# Patient Record
Sex: Male | Born: 1960 | Race: White | Hispanic: No | Marital: Married | Smoking: Never smoker
Health system: Southern US, Community
[De-identification: ages and names within clinical notes are randomized; demographics above are authoritative.]

---

## 2004-05-11 ENCOUNTER — Encounter: Admission: RE | Admit: 2004-05-11 | Discharge: 2004-05-11 | Payer: Self-pay | Admitting: Gastroenterology

## 2007-09-22 ENCOUNTER — Encounter: Admission: RE | Admit: 2007-09-22 | Discharge: 2007-09-22 | Payer: Self-pay | Admitting: Sports Medicine

## 2007-10-13 ENCOUNTER — Encounter: Admission: RE | Admit: 2007-10-13 | Discharge: 2007-10-13 | Payer: Self-pay | Admitting: Sports Medicine

## 2010-04-27 ENCOUNTER — Encounter: Payer: Self-pay | Admitting: Sports Medicine

## 2011-07-01 ENCOUNTER — Ambulatory Visit (INDEPENDENT_AMBULATORY_CARE_PROVIDER_SITE_OTHER): Payer: 59 | Admitting: Family Medicine

## 2011-07-01 ENCOUNTER — Encounter: Payer: Self-pay | Admitting: Family Medicine

## 2011-07-01 DIAGNOSIS — R030 Elevated blood-pressure reading, without diagnosis of hypertension: Secondary | ICD-10-CM

## 2011-07-01 DIAGNOSIS — Z1211 Encounter for screening for malignant neoplasm of colon: Secondary | ICD-10-CM

## 2011-07-01 DIAGNOSIS — M109 Gout, unspecified: Secondary | ICD-10-CM

## 2011-07-01 DIAGNOSIS — N529 Male erectile dysfunction, unspecified: Secondary | ICD-10-CM

## 2011-07-01 MED ORDER — TADALAFIL 20 MG PO TABS: ORAL_TABLET | ORAL | Status: DC

## 2011-07-01 NOTE — Progress Notes (Signed)
  Subjective:    Patient ID: Eduardo Lynch, male    DOB: May 24, 1960, 51 y.o.   MRN: [id number]  HPI  Patient is here to establish care. He has not had complete physical in several years. He's had what sounds like probable gout involving the foot metatarsophalangeal joint and more recently right wrist. He has seen orthopedist couple times. No other chronic medical problems. He had some recent issues with weight gain. He attributes this to lack of activity and exercise. Started running program 2 weeks ago. No prior surgeries. Currently takes no medications.  Family history significant for mother with type 2 diabetes and father with COPD. Son with type 1 diabetes.  Patient is nonsmoker. Occasional alcohol use.  New issue of progressive erectile dysfunction. He has good libido but difficulty maintaining erection. Never treated for elevated blood pressure.   Review of Systems  Constitutional: Positive for unexpected weight change (weight gain). Negative for fever, activity change, appetite change and fatigue.  HENT: Negative for ear pain, congestion and trouble swallowing.   Eyes: Negative for pain and visual disturbance.  Respiratory: Negative for cough, shortness of breath and wheezing.   Cardiovascular: Negative for chest pain and palpitations.  Gastrointestinal: Negative for nausea, vomiting, abdominal pain, diarrhea, constipation, blood in stool, abdominal distention and rectal pain.  Genitourinary: Negative for dysuria, hematuria and testicular pain.  Musculoskeletal: Negative for joint swelling and arthralgias.  Skin: Negative for rash.  Neurological: Negative for dizziness, syncope and headaches.  Hematological: Negative for adenopathy.  Psychiatric/Behavioral: Negative for confusion and dysphoric mood.       Objective:   Physical Exam  Constitutional: He is oriented to person, place, and time. He appears well-developed and well-nourished.  HENT:  Right Ear: External ear normal.    Left Ear: External ear normal.  Mouth/Throat: Oropharynx is clear and moist.  Neck: Neck supple. No thyromegaly present.  Cardiovascular: Normal rate and regular rhythm.   Pulmonary/Chest: Effort normal and breath sounds normal. No respiratory distress. He has no wheezes. He has no rales.  Musculoskeletal: He exhibits no edema.  Lymphadenopathy:    He has no cervical adenopathy.  Neurological: He is alert and oriented to person, place, and time.          Assessment & Plan:  #1 weight gain. Probably related to sedentary activity. Weight loss strategies discussed. Schedule complete physical.  #2 elevated blood pressure. Repeat blood pressure after rest improved to 144/80. Recommend lifestyle modification and reassess at physical in 3 months #3 erectile dysfunction. Trial of Cialis 20 mg every other day as needed #4 Health maintenance.  No hx of colon cancer screening and pt wishes to proceed.

## 2011-09-09 ENCOUNTER — Encounter: Payer: Self-pay | Admitting: Gastroenterology

## 2011-09-30 ENCOUNTER — Ambulatory Visit: Payer: 59 | Admitting: Family Medicine

## 2011-10-04 ENCOUNTER — Ambulatory Visit: Payer: 59 | Admitting: Family Medicine

## 2011-11-10 ENCOUNTER — Other Ambulatory Visit: Payer: 59

## 2011-11-17 ENCOUNTER — Encounter: Payer: 59 | Admitting: Family Medicine

## 2011-11-23 ENCOUNTER — Other Ambulatory Visit: Payer: Self-pay | Admitting: Family Medicine

## 2011-12-05 ENCOUNTER — Emergency Department (HOSPITAL_BASED_OUTPATIENT_CLINIC_OR_DEPARTMENT_OTHER)
Admission: EM | Admit: 2011-12-05 | Discharge: 2011-12-05 | Disposition: A | Discharge status: Home / Self Care | Payer: 59 | Attending: Emergency Medicine | Admitting: Emergency Medicine

## 2011-12-05 ENCOUNTER — Encounter (HOSPITAL_BASED_OUTPATIENT_CLINIC_OR_DEPARTMENT_OTHER): Payer: Self-pay | Admitting: *Deleted

## 2011-12-05 DIAGNOSIS — M109 Gout, unspecified: Secondary | ICD-10-CM

## 2011-12-05 DIAGNOSIS — M79609 Pain in unspecified limb: Secondary | ICD-10-CM | POA: Insufficient documentation

## 2011-12-05 MED ORDER — INDOMETHACIN 25 MG PO CAPS: 25.0000 mg | ORAL_CAPSULE | Freq: Three times a day (TID) | ORAL | Status: AC | PRN

## 2011-12-05 MED ORDER — OXYCODONE-ACETAMINOPHEN 5-325 MG PO TABS: 1.0000 | ORAL_TABLET | ORAL | Status: AC | PRN

## 2011-12-05 NOTE — ED Provider Notes (Signed)
History  This chart was scribed for Eduardo Quarry, MD by Erskine Emery. This patient was seen in room MH08/MH08 and the patient's care was started at 18:02.   CSN: [id number]  Arrival date & time 12/05/11  1512   First MD Initiated Contact with Patient 12/05/11 1802      Chief Complaint  Patient presents with  . Foot Pain    (Consider location/radiation/quality/duration/timing/severity/associated sxs/prior treatment) The history is provided by the patient. No language interpreter was used.  Eduardo Lynch is a 51 y.o. male who presents to the Emergency Department complaining of a gout flare up near the big toe of his right foot for the last couple weeks. Pt reports this is his third attack of gout of this severity. Pt tried treating it with Ibuprofen with no relief from symptoms. Pt has NKDA and no other health problems.   Dr. Caryl Never is the pt's PCP who he will follow up with on Tuesday.   Past Medical History  Diagnosis Date  . Gout     History reviewed. No pertinent past surgical history.  Family History  Problem Relation Age of Onset  . Arthritis Mother   . COPD Mother   . Diabetes Mother     type ll    History  Substance Use Topics  . Smoking status: Never Smoker   . Smokeless tobacco: Not on file  . Alcohol Use: Not on file      Review of Systems  Constitutional: Negative for fever and chills.  Respiratory: Negative for shortness of breath.   Gastrointestinal: Negative for nausea and vomiting.  Musculoskeletal:       Gout flare up on right foot.  Neurological: Negative for weakness.  All other systems reviewed and are negative.    Allergies  Review of patient's allergies indicates no known allergies.  Home Medications   Current Outpatient Rx  Name Route Sig Dispense Refill  . IBUPROFEN 200 MG PO TABS Oral Take 600 mg by mouth every 6 (six) hours as needed. For pain.    Marland Kitchen MENS MULTI VITAMIN & MINERAL PO Oral Take by mouth daily.      BP 151/79   Pulse 67  Temp 97.9 F (36.6 C) (Oral)  Resp 20  Ht 6\' 4"  (1.93 m)  Wt 240 lb (108.863 kg)  BMI 29.21 kg/m2  SpO2 98%  Physical Exam  Nursing note and vitals reviewed. Constitutional: He is oriented to person, place, and time. He appears well-developed and well-nourished. No distress.  HENT:  Head: Normocephalic and atraumatic.  Eyes: EOM are normal. Pupils are equal, round, and reactive to light.  Neck: Neck supple. No tracheal deviation present.  Cardiovascular: Normal rate.   Pulmonary/Chest: Effort normal. No respiratory distress.  Abdominal: Soft. He exhibits no distension.  Musculoskeletal: Normal range of motion. He exhibits no edema.  Neurological: He is alert and oriented to person, place, and time.  Skin: Skin is warm and dry.       Swollen erythematous area over the 1st MTP joint.   Psychiatric: He has a normal mood and affect.    ED Course  Procedures (including critical care time) DIAGNOSTIC STUDIES: Oxygen Saturation is 98% on room air, normal by my interpretation.    COORDINATION OF CARE: 18:15--I evaluated the patient and we discussed a treatment plan including medication to which the pt agreed.    Labs Reviewed - No data to display No results found.   No diagnosis found.    MDM  I personally performed the services described in this documentation, which was scribed in my presence. The recorded information has been reviewed and considered.    Eduardo Quarry, MD 12/05/11 [phone]

## 2011-12-05 NOTE — ED Notes (Signed)
Pt has hx of gout and states this is a "flare-up". C/O pain to right foot. Took Ibuprofen with some relief.

## 2011-12-20 ENCOUNTER — Encounter: Payer: 59 | Admitting: Family Medicine

## 2011-12-21 ENCOUNTER — Other Ambulatory Visit: Payer: 59

## 2012-01-19 ENCOUNTER — Other Ambulatory Visit: Payer: 59

## 2012-01-26 ENCOUNTER — Encounter: Payer: 59 | Admitting: Family Medicine

## 2012-07-10 ENCOUNTER — Other Ambulatory Visit: Payer: Self-pay | Admitting: Physical Medicine and Rehabilitation

## 2012-07-10 DIAGNOSIS — M545 Low back pain, unspecified: Secondary | ICD-10-CM

## 2012-07-15 ENCOUNTER — Ambulatory Visit
Admission: RE | Admit: 2012-07-15 | Discharge: 2012-07-15 | Disposition: A | Discharge status: Home / Self Care | Payer: 59 | Source: Ambulatory Visit | Attending: Physical Medicine and Rehabilitation | Admitting: Physical Medicine and Rehabilitation

## 2012-07-15 DIAGNOSIS — M545 Low back pain: Secondary | ICD-10-CM

## 2013-09-04 ENCOUNTER — Ambulatory Visit (INDEPENDENT_AMBULATORY_CARE_PROVIDER_SITE_OTHER): Payer: 59 | Admitting: Family Medicine

## 2013-09-04 ENCOUNTER — Other Ambulatory Visit (INDEPENDENT_AMBULATORY_CARE_PROVIDER_SITE_OTHER): Payer: 59

## 2013-09-04 ENCOUNTER — Telehealth: Payer: Self-pay | Admitting: *Deleted

## 2013-09-04 ENCOUNTER — Ambulatory Visit: Payer: 59 | Admitting: Family Medicine

## 2013-09-04 ENCOUNTER — Ambulatory Visit (INDEPENDENT_AMBULATORY_CARE_PROVIDER_SITE_OTHER)
Admission: RE | Admit: 2013-09-04 | Discharge: 2013-09-04 | Disposition: A | Discharge status: Home / Self Care | Payer: 59 | Source: Ambulatory Visit | Attending: Family Medicine | Admitting: Family Medicine

## 2013-09-04 ENCOUNTER — Encounter: Payer: Self-pay | Admitting: Family Medicine

## 2013-09-04 DIAGNOSIS — M79671 Pain in right foot: Secondary | ICD-10-CM

## 2013-09-04 DIAGNOSIS — M8430XA Stress fracture, unspecified site, initial encounter for fracture: Secondary | ICD-10-CM

## 2013-09-04 DIAGNOSIS — M79609 Pain in unspecified limb: Secondary | ICD-10-CM

## 2013-09-04 DIAGNOSIS — M79672 Pain in left foot: Secondary | ICD-10-CM

## 2013-09-04 DIAGNOSIS — M84376A Stress fracture, unspecified foot, initial encounter for fracture: Secondary | ICD-10-CM

## 2013-09-04 MED ORDER — MELOXICAM 15 MG PO TABS: 15.0000 mg | ORAL_TABLET | Freq: Every day | ORAL | Status: DC

## 2013-09-04 NOTE — Progress Notes (Signed)
Eduardo ScaleZach Roshawna Lynch D.O. [facility] [address] [place], [place] [address] Phone: [phone] Subjective:    I'm seeing this patient by the request  of:  Kristian CoveyBURCHETTE,BRUCE W, MD   CC: Foot pain right  IHK:[id number]:Subjective Eduardo Lynch is a 53 y.o. male coming in with complaint of right foot pain for the last 2 weeks. Patient states it does seem to start on the dorsal aspect of the foot. Patient thinks he may have hurt it while he was weight training. Patient states though that he went and had a massage and after that now he is having plantar aspect pain pain. Patient states it is starting to become worse. Patient desiring Lainez injury but has been working out some more with weights and thinks that this has exacerbated. No pain with the activity but it seems to be afterwards he is a dull throbbing sensation. Pain noted he is considerably worse.     Past medical history, social, surgical and family history all reviewed in electronic medical record.   Review of Systems: No headache, visual changes, nausea, vomiting, diarrhea, constipation, dizziness, abdominal pain, skin rash, fevers, chills, night sweats, weight loss, swollen lymph nodes, body aches, joint swelling, muscle aches, chest pain, shortness of breath, mood changes.   Objective There were no vitals taken for this visit.  General: No apparent distress alert and oriented x3 mood and affect normal, dressed appropriately.  HEENT: Pupils equal, extraocular movements intact  Respiratory: Patient's speak in full sentences and does not appear short of breath  Cardiovascular: No lower extremity edema, non tender, no erythema  Skin: Warm dry intact with no signs of infection or rash on extremities or on axial skeleton.  Abdomen: Soft nontender  Neuro: Cranial nerves II through XII are intact, neurovascularly intact in all extremities with 2+ DTRs and 2+ pulses.  Lymph: No lymphadenopathy of posterior or anterior cervical chain or axillae  bilaterally.  Gait normal with good balance and coordination.  MSK:  Non tender with full range of motion and good stability and symmetric strength and tone of shoulders, elbows, wrist, hip, knee and ankles bilaterally.  Left foot exam shows the patient does have swelling of the dorsal aspect of the foot. Patient also has callus formation on the plantar aspect of the foot over the second and third metatarsal heads. Patient is tender to palpation in both the dorsal midfoot area mostly over the second and third metatarsals at their base as well as the heads of the second and third metatarsals. Neurovascularly intact distally with good capillary refill.  MSK US performed of: Left ankle This study was ordered, performed, and interpreted by Terrilee FilesZach Lainie Daubert D.O.  Foot/Ankle:   All structures visualized.  Patient does have soft tissue swelling mostly over the dorsal aspect of the foot. Talar dome unremarkable  Ankle mortise without effusion. Peroneus longus and brevis tendons unremarkable on long and transverse views without sheath effusions. Posterior tibialis, flexor hallucis longus, and flexor digitorum longus tendons unremarkable on long and transverse views without sheath effusions. Achilles tendon visualized along length of tendon and unremarkable on long and transverse views without sheath effusion. Anterior Talofibular Ligament and Calcaneofibular Ligaments unremarkable and intact. Deltoid Ligament unremarkable and intact. Plantar fascia intact and without effusion, normal thickness. No increased doppler signal, cap sign, or thickening of tibial cortex. Power doppler signal normal. Midfoot arthritis is noted. Patient also has what appears to be too stress fractures with mild avulsion fractures of the base of the second and  third metatarsals. Increasing Doppler flow noted. No Amini injury to the Lisfranc ligament noted.  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     Impression and Recommendations:     This case required medical decision making of moderate complexity.

## 2013-09-04 NOTE — Telephone Encounter (Signed)
Discussed with patient at this time. There is a concern for a Lisfranc injury. Patient is recommended in 7-10 days for further evaluation. Patient is not making improvement we'll consider a CT scan or an MRI for further evaluation.

## 2013-09-04 NOTE — Assessment & Plan Note (Signed)
It appears the patient does have a stress reaction mostly of the second and third metatarsals at the base. I do not see any significant midfoot pathology such as a Lisfranc injury. Patient's will do icing, anti-inflammatories, as well as given a postop boot today for more of a rigid sole. Patient is to try these interventions and come back again in 3 weeks for further evaluation. Differential also includes a gout exacerbation but ultrasound no specific crystal arthropathy noted. Patient continues to have pain we may need to consider further imaging. X-rays ordered today and read pending.

## 2013-09-04 NOTE — Patient Instructions (Addendum)
Nice to meet you Ice bath 20 minutes 2 times a day Meloxciam daily for 10 days then as needed Rigid sole shoes for now.  Boot, Dansko, [place], [place] all good.  Vitamin D 2000 IU daily.  Biking or swimming is good, avoid jumping or running.  Weights ok on machines.  xrays downstairs today.  Come back in 3 weeks.

## 2013-09-04 NOTE — Telephone Encounter (Signed)
Pt called requesting Xray results.  Please advise 

## 2013-09-25 ENCOUNTER — Ambulatory Visit (INDEPENDENT_AMBULATORY_CARE_PROVIDER_SITE_OTHER)
Admission: RE | Admit: 2013-09-25 | Discharge: 2013-09-25 | Disposition: A | Discharge status: Home / Self Care | Payer: 59 | Source: Ambulatory Visit | Attending: Family Medicine | Admitting: Family Medicine

## 2013-09-25 ENCOUNTER — Ambulatory Visit (INDEPENDENT_AMBULATORY_CARE_PROVIDER_SITE_OTHER): Payer: 59 | Admitting: Family Medicine

## 2013-09-25 ENCOUNTER — Encounter: Payer: Self-pay | Admitting: Family Medicine

## 2013-09-25 ENCOUNTER — Other Ambulatory Visit (INDEPENDENT_AMBULATORY_CARE_PROVIDER_SITE_OTHER): Payer: 59

## 2013-09-25 VITALS — BP 138/84 | HR 65 | Ht 76.0 in | Wt 241.0 lb

## 2013-09-25 DIAGNOSIS — Z4789 Encounter for other orthopedic aftercare: Secondary | ICD-10-CM

## 2013-09-25 DIAGNOSIS — M84375D Stress fracture, left foot, subsequent encounter for fracture with routine healing: Secondary | ICD-10-CM

## 2013-09-25 MED ORDER — MELOXICAM 15 MG PO TABS: 15.0000 mg | ORAL_TABLET | Freq: Every day | ORAL | Status: AC

## 2013-09-25 NOTE — Patient Instructions (Signed)
It is great to see you again.  We will get another xray today Ice still at end of night Refilled the meloxicam  Add turmeric 500mg  2 times daily Biking and swimming OK if you want more exercise.  See me again in 3 weeks

## 2013-09-25 NOTE — Assessment & Plan Note (Addendum)
Patient's seems to be healing somewhat and does not have a significant amount of inflammation was seen previously. Patient is able to ambulate better but is still tender on palpation. I would like to get repeat x-rays to reevaluate for the fracture that was seen at the base of the second metatarsal. This does list a concern for a possibility of a Lisfranc injury. Patient though is able to ambulate which is a good sign. Patient is to continue with conservative therapy including icing, anti-inflammatories, and postop boot for another 3 weeks. If patient has any worsening pain in the meantime he'll give us a call and we'll consider ordering an MRI  Spent greater than 25 minutes with patient face-to-face and had greater than 50% of counseling including as described above in assessment and plan.

## 2013-09-25 NOTE — Progress Notes (Signed)
Eduardo ScaleZach Lynch D.O. Eduardo Lynch [address] [place], [place] [address] Phone: [phone] Subjective:    I'm seeing this patient by the request  of:  Eduardo CoveyBURCHETTE,Eduardo W, MD   CC: Foot pain right  BJY:[id number]:Subjective Eduardo PummelWilliam R Lynch is a 53 y.o. male coming in with complaint of right foot pain.  Patient was seen previously and was diagnosed with more of a stress fracture of the second and third metatarsal heads. Patient did have an x-ray showing that he did have a normal for near complete avulsion fracture at the base of the second metatarsal as well. Patient is to do conservative therapy and was in a Personal assistantCam Walker boot, we discussed icing protocol, as discussed Tylenol for pain relief and was given some easy range of motion exercises. Patient states that the swelling is significantly better than it was. Patient states that his daily activities are not as painful as they were. Patient has not been doing any exercises at continues to wear the postop. Patient denies any new symptoms. Continues to meloxicam.     Past medical history, social, surgical and family history all reviewed in electronic medical record.   Review of Systems: No headache, visual changes, nausea, vomiting, diarrhea, constipation, dizziness, abdominal pain, skin rash, fevers, chills, night sweats, weight loss, swollen lymph nodes, body aches, joint swelling, muscle aches, chest pain, shortness of breath, mood changes.   Objective Blood pressure 138/84, pulse 65, height 6\' 4"  (1.93 m), weight 241 lb (109.317 kg), SpO2 97.00%.  General: No apparent distress alert and oriented x3 mood and affect normal, dressed appropriately.  HEENT: Pupils equal, extraocular movements intact  Respiratory: Patient's speak in full sentences and does not appear short of breath  Cardiovascular: No lower extremity edema, non tender, no erythema  Skin: Warm dry intact with no signs of infection or rash on extremities or on axial skeleton.    Abdomen: Soft nontender  Neuro: Cranial nerves II through XII are intact, neurovascularly intact in all extremities with 2+ DTRs and 2+ pulses.  Lymph: No lymphadenopathy of posterior or anterior cervical chain or axillae bilaterally.  Gait normal with good balance and coordination.  MSK:  Non tender with full range of motion and good stability and symmetric strength and tone of shoulders, elbows, wrist, hip, knee and ankles bilaterally.  Left foot exam shows no swelling Patient also has callus formation on the plantar aspect of the foot over the second and third metatarsal heads. Patient is still tender to palpation mostly over the base of the phalanx but not at the base of the metatarsal of the second toe. Neurovascularly intact distally with good capillary refill.  MSK US performed of: Left ankle This study was ordered, performed, and interpreted by Terrilee FilesZach Lynch D.O.  Foot/Ankle:   All structures visualized. Talar dome unremarkable  Ankle mortise without effusion. Peroneus longus and brevis tendons unremarkable on long and transverse views without sheath effusions. Posterior tibialis, flexor hallucis longus, and flexor digitorum longus tendons unremarkable on long and transverse views without sheath effusions. Achilles tendon visualized along length of tendon and unremarkable on long and transverse views without sheath effusion. Anterior Talofibular Ligament and Calcaneofibular Ligaments unremarkable and intact. Deltoid Ligament unremarkable and intact. Plantar fascia intact and without effusion, normal thickness. No increased doppler signal, cap sign, or thickening of tibial cortex. Power doppler signal normal. Midfoot arthritis is noted. Patient's second metatarsal still has what appears to be a fracture noted. Patient is nontender over this area. Patient's second phalanx  base though does have an area of tenderness with some mild hypoechoic changes but no specific findings..  IMPRESSION:  Mild midfoot arthritis with continued second metatarsal fracture. Nonspecific swelling of the second phalanx     Impression and Recommendations:     This case required medical decision making of moderate complexity.

## 2013-10-18 ENCOUNTER — Encounter: Payer: Self-pay | Admitting: Family Medicine

## 2013-10-18 ENCOUNTER — Ambulatory Visit (INDEPENDENT_AMBULATORY_CARE_PROVIDER_SITE_OTHER): Payer: 59 | Admitting: Family Medicine

## 2013-10-18 VITALS — BP 118/84 | HR 67 | Temp 97.7°F | Ht 76.0 in | Wt 244.2 lb

## 2013-10-18 DIAGNOSIS — M84375D Stress fracture, left foot, subsequent encounter for fracture with routine healing: Secondary | ICD-10-CM

## 2013-10-18 DIAGNOSIS — Z4789 Encounter for other orthopedic aftercare: Secondary | ICD-10-CM

## 2013-10-18 NOTE — Patient Instructions (Signed)
Overall doing good.  Ice still at night.  Back to shoes but need to be rigid Lelon FrohlichKeen, [place], [place] and New balance greater than 700 OK to lift and do other activity 3 times a week.  Start low weights and increase reps.  No running.   No jumping.  COme back if you want in 3 weeks.

## 2013-10-18 NOTE — Progress Notes (Signed)
Pre visit review using our clinic review tool, if applicable. No additional management support is needed unless otherwise documented below in the visit note. 

## 2013-10-18 NOTE — Progress Notes (Signed)
Tawana ScaleZach Pina Sirianni D.O. [facility] [address] [place], [place] [address] Phone: [phone] Subjective:      CC: Foot pain right followup  BJY:NWGNFAOZHYHPI:Subjective Eduardo Lynch is a 53 y.o. male coming in with complaint of right foot pain.  Patient was seen previously and was diagnosed with  a stress fracture of the second and third metatarsal heads. Patient did have an x-ray showing that he did have a normal for near complete avulsion fracture at the base of the second metatarsal as well. Patient is to do conservative therapy and was in a Personal assistantCam Walker boot, we discussed icing protocol, as discussed Tylenol for pain relief and was given some easy range of motion exercises. Patient states at this time he is not having any significant pain. Patient has been able to do more activities. Patient states it is not having pain at night anymore. Denies any swelling. Patient continues to wear the boot but has not been in shoes regularly.     Past medical history, social, surgical and family history all reviewed in electronic medical record.   Review of Systems: No headache, visual changes, nausea, vomiting, diarrhea, constipation, dizziness, abdominal pain, skin rash, fevers, chills, night sweats, weight loss, swollen lymph nodes, body aches, joint swelling, muscle aches, chest pain, shortness of breath, mood changes.   Objective Blood pressure 118/84, pulse 67, temperature 97.7 F (36.5 C), temperature source Oral, height 6\' 4"  (1.93 m), weight 244 lb 4 oz (110.791 kg), SpO2 97.00%.  General: No apparent distress alert and oriented x3 mood and affect normal, dressed appropriately.  HEENT: Pupils equal, extraocular movements intact  Respiratory: Patient's speak in full sentences and does not appear short of breath  Cardiovascular: No lower extremity edema, non tender, no erythema  Skin: Warm dry intact with no signs of infection or rash on extremities or on axial skeleton.  Abdomen: Soft nontender    Neuro: Cranial nerves II through XII are intact, neurovascularly intact in all extremities with 2+ DTRs and 2+ pulses.  Lymph: No lymphadenopathy of posterior or anterior cervical chain or axillae bilaterally.  Gait normal with good balance and coordination.  MSK:  Non tender with full range of motion and good stability and symmetric strength and tone of shoulders, elbows, wrist, hip, knee and ankles bilaterally.  Left foot exam shows no swelling patient is nontender in exam today. Neurovascularly intact distally with good capillary refill. Patient is a bunion and bunionette formations with collection of the transverse arch.  MSK US performed of: Left ankle This study was ordered, performed, and interpreted by Terrilee FilesZach Patrena Santalucia D.O.  Foot/Ankle:   All structures visualized. Talar dome unremarkable  Ankle mortise without effusion. Peroneus longus and brevis tendons unremarkable on long and transverse views without sheath effusions. Posterior tibialis, flexor hallucis longus, and flexor digitorum longus tendons unremarkable on long and transverse views without sheath effusions. Achilles tendon visualized along length of tendon and unremarkable on long and transverse views without sheath effusion. Anterior Talofibular Ligament and Calcaneofibular Ligaments unremarkable and intact. Deltoid Ligament unremarkable and intact. Plantar fascia intact and without effusion, normal thickness. No increased doppler signal, cap sign, or thickening of tibial cortex. Power doppler signal normal. Midfoot arthritis is noted. Has callus formation over the second metatarsal fracture that was previously seen.. Patient is nontender over this area. Patient is nontender on exam  IMPRESSION: Mild midfoot arthritis with healed metatarsal stress fracture.     Impression and Recommendations:     This case required medical  decision making of moderate complexity.

## 2013-10-18 NOTE — Assessment & Plan Note (Signed)
Patient is making good strides at this time. Encourage him to continue to vitamin supplementations we discussed previously and patient was able to transition into shoes. Patient was given a return to workout routine increasing her weight slowly. We discussed avoiding any high-intensity interval training for now. No jumping no running. Patient is able to do almost any other activity he feels fit. Patient will follow up again in 3 weeks just to make sure he continues to make improvement. Patient does have significant bunion formation of the feet. I do believe that he will do well with custom orthotics. We'll discuss this at followup in 3 weeks' time.  Spent greater than 25 minutes with patient face-to-face and had greater than 50% of counseling including as described above in assessment and plan.

## 2013-11-15 ENCOUNTER — Ambulatory Visit: Payer: 59 | Admitting: Family Medicine

## 2013-11-29 ENCOUNTER — Ambulatory Visit: Payer: 59 | Admitting: Family Medicine

## 2013-12-12 ENCOUNTER — Ambulatory Visit: Payer: 59 | Admitting: Family Medicine

## 2014-09-20 IMAGING — CR DG FOOT COMPLETE 3+V*L*
3 series · 3 of 3 positions shown · non-contrast
Comparison: None.

CLINICAL DATA: Left foot pain.

EXAM:
LEFT FOOT - COMPLETE 3+ VIEW

[view not recorded (1 of 3)]
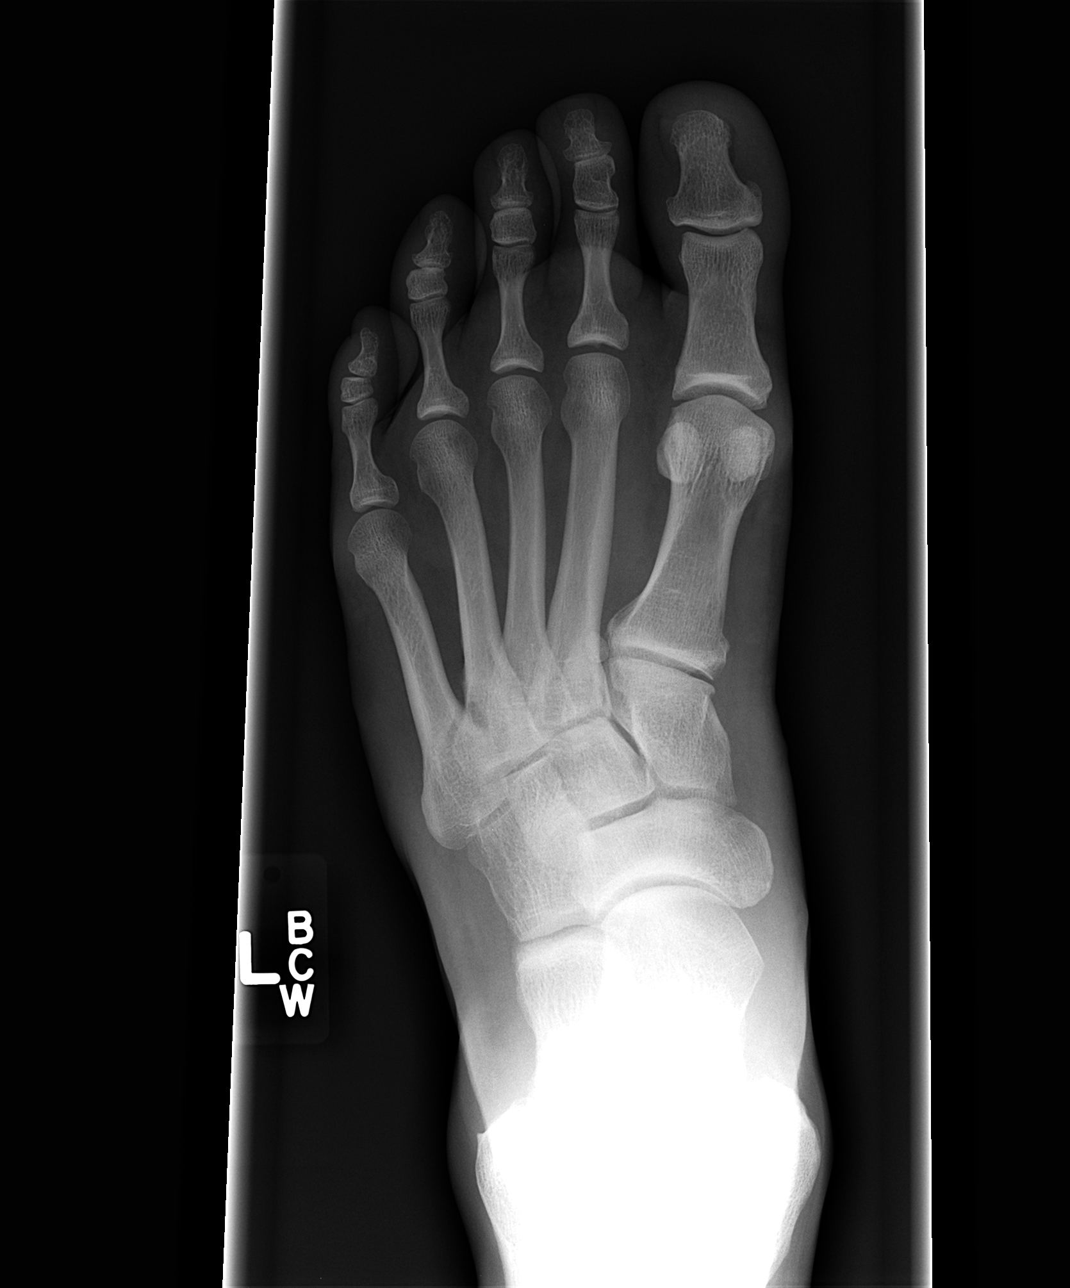

[view not recorded (2 of 3)]
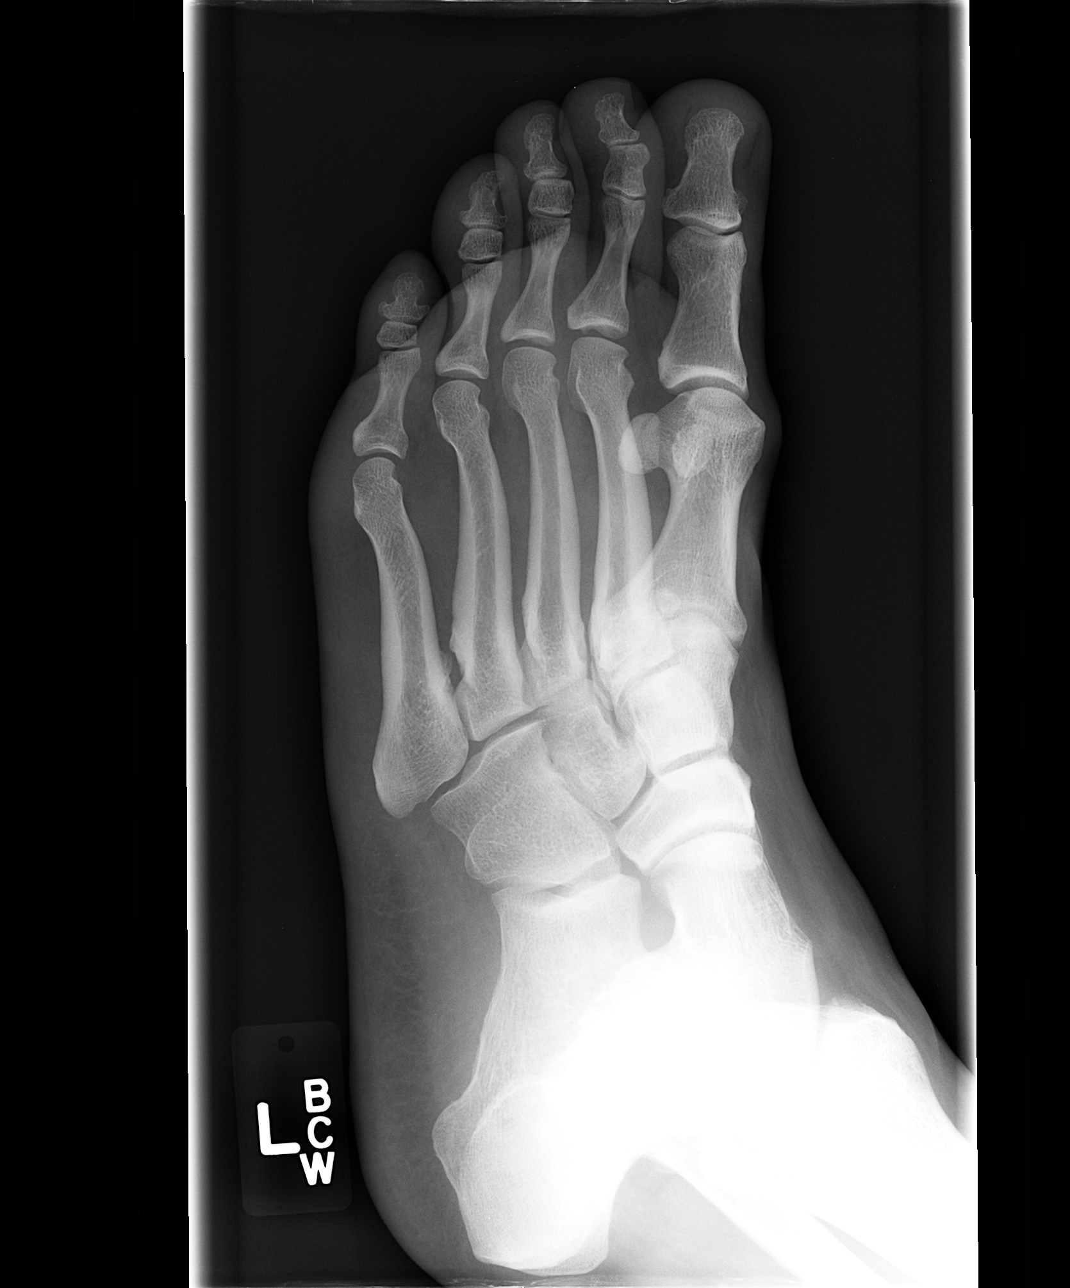

[view not recorded (3 of 3)]
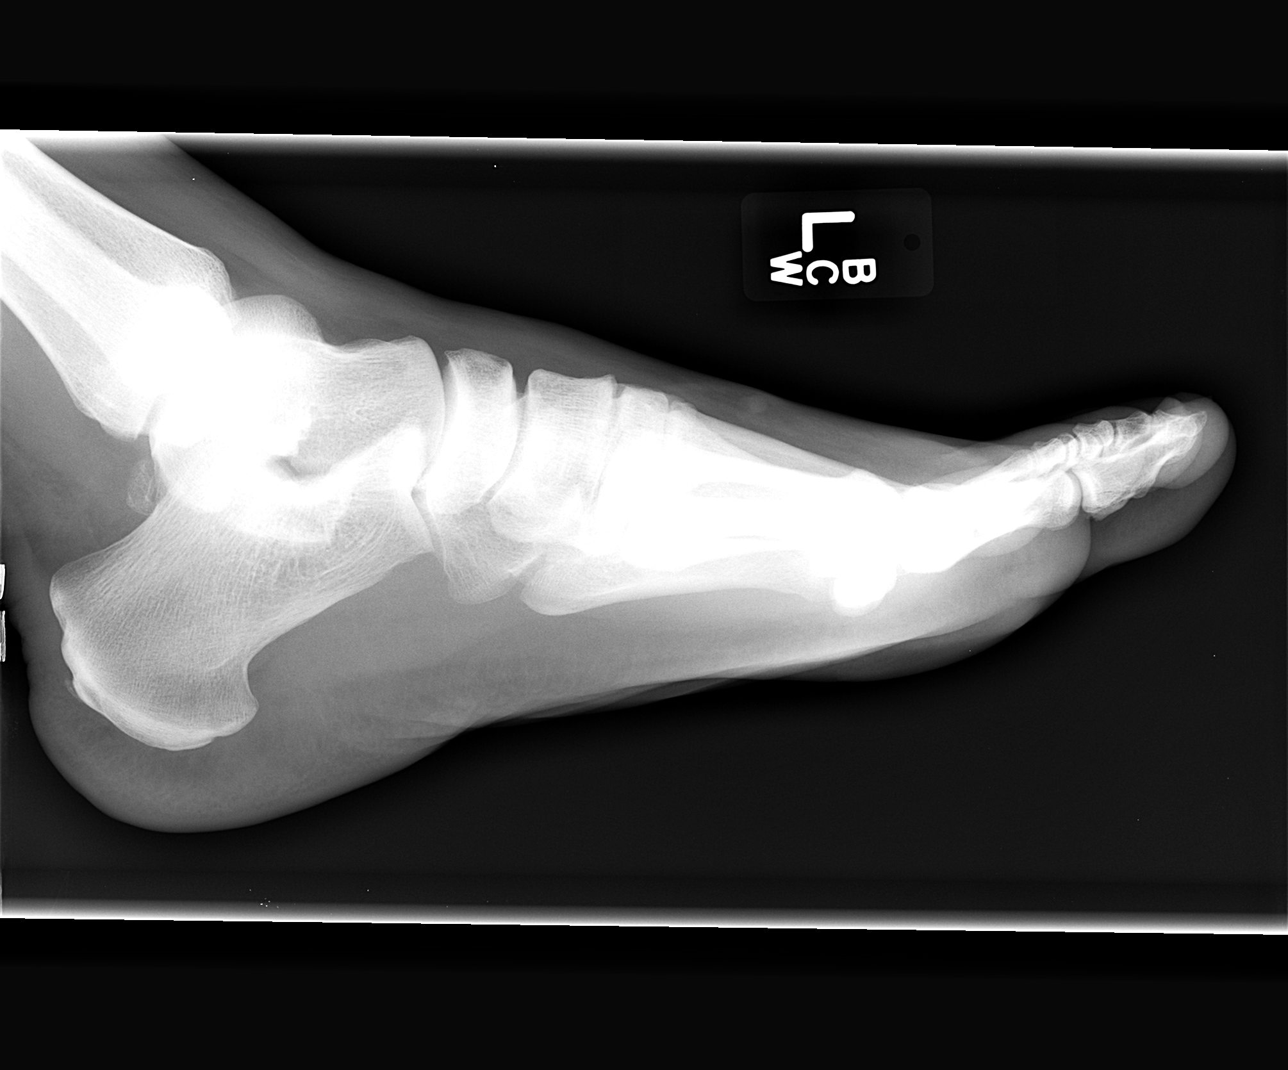

[3 of 3 positions shown; findings below may reference images not displayed]

FINDINGS: Mild midfoot degenerative change. Normal anatomic alignment of the
midfoot. There is a possible fracture involving the base of the
second metatarsal demonstrated best on the AP and oblique views. No
evidence for additional associated acute fractures. No significant
soft tissue swelling.
IMPRESSION: Possible fracture involving the base of the second metatarsal
demonstrated best on the AP and oblique views. Anatomic alignment at
the midfoot. Consider further evaluation with CT.

Midfoot degenerative changes.

These results will be called to the ordering clinician or
representative by the Radiologist Assistant, and communication
documented in the PACS or zVision Dashboard.

## 2014-10-11 IMAGING — CR DG FOOT COMPLETE 3+V*L*
3 series · 3 of 3 positions shown · non-contrast
Comparison: Left foot films of 09/04/2013

CLINICAL DATA: Left foot pain

EXAM:
LEFT FOOT - COMPLETE 3+ VIEW

[view not recorded (1 of 3)]
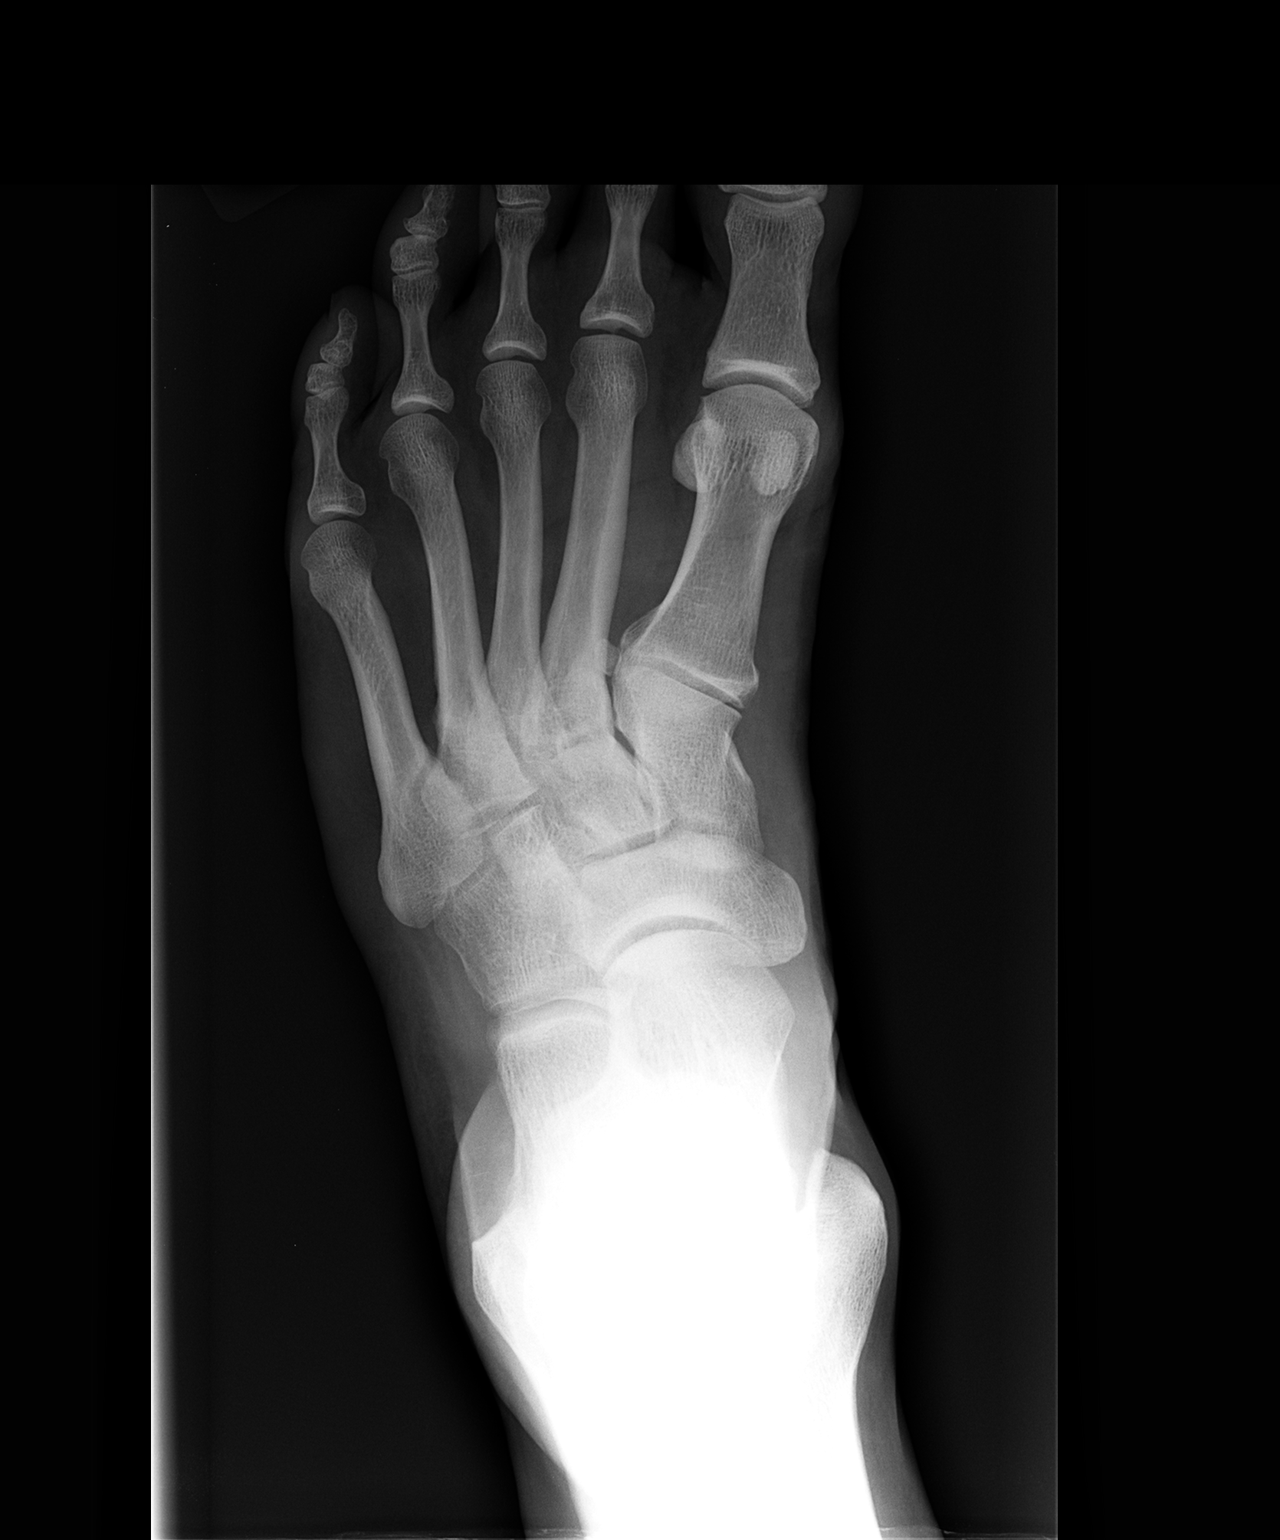

[view not recorded (2 of 3)]
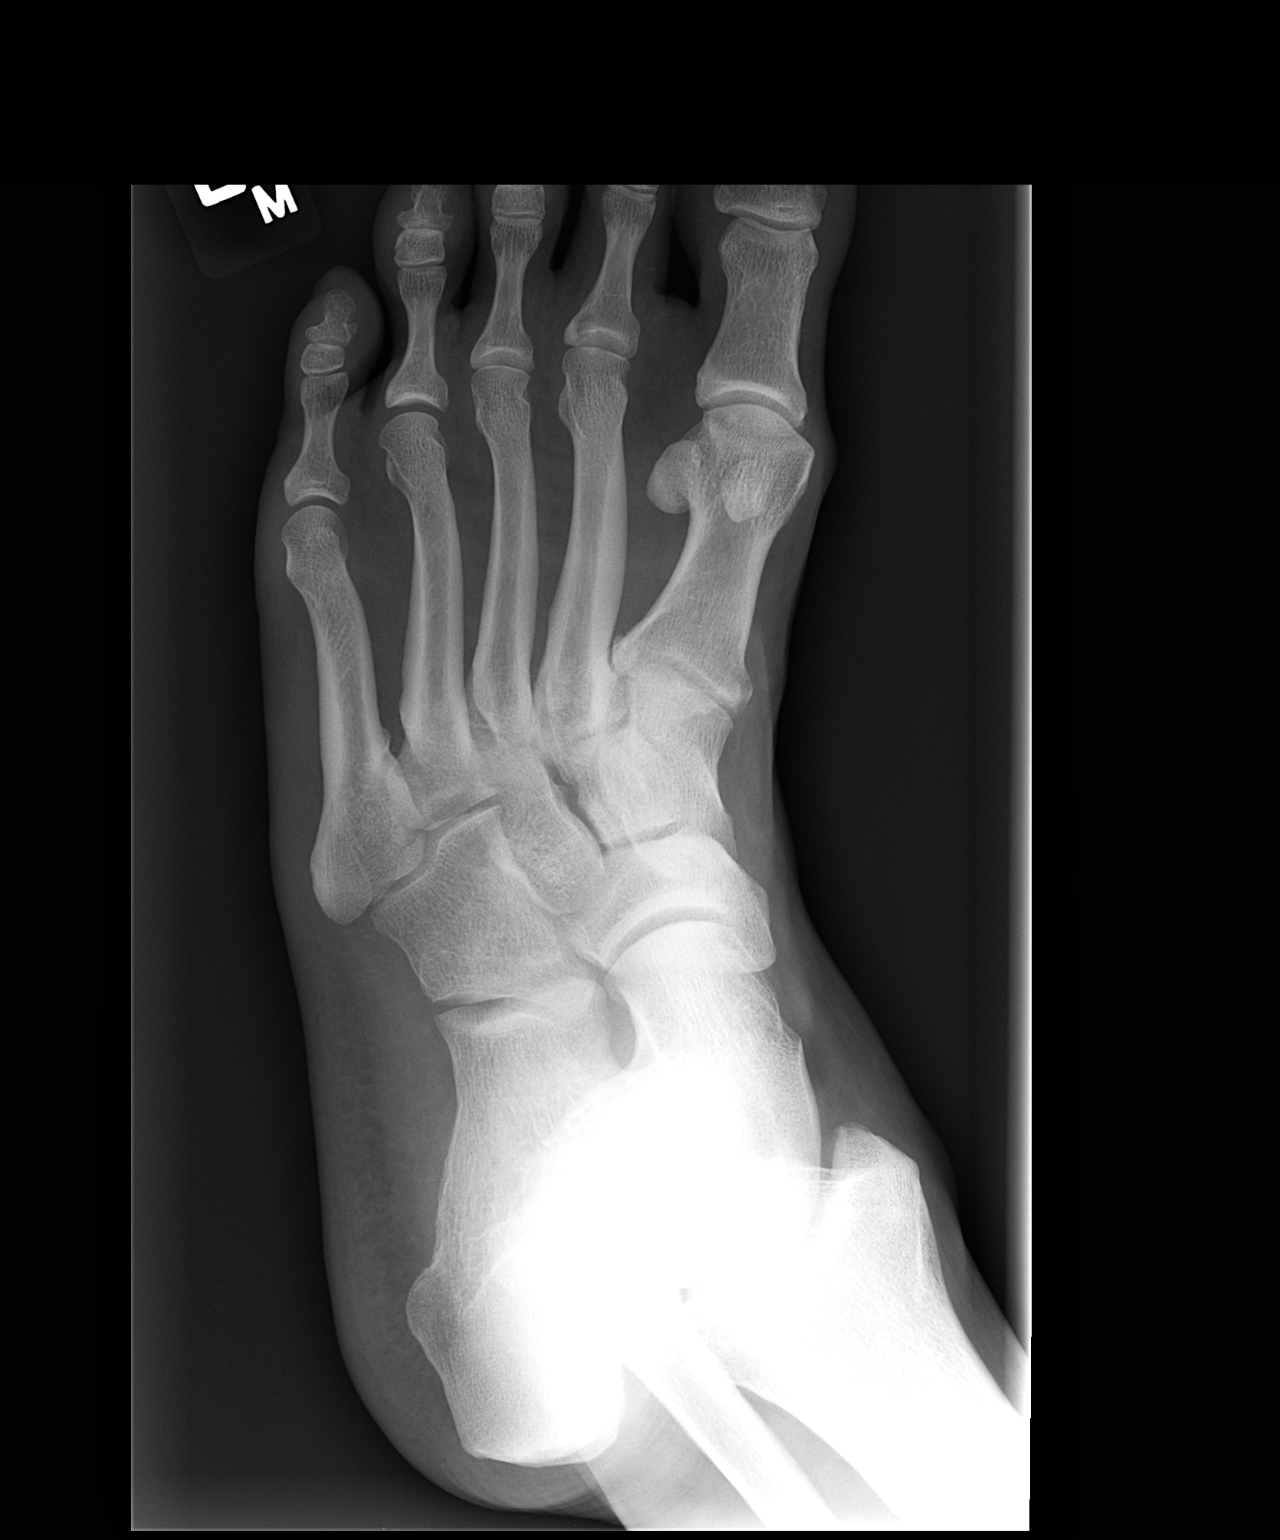

[view not recorded (3 of 3)]
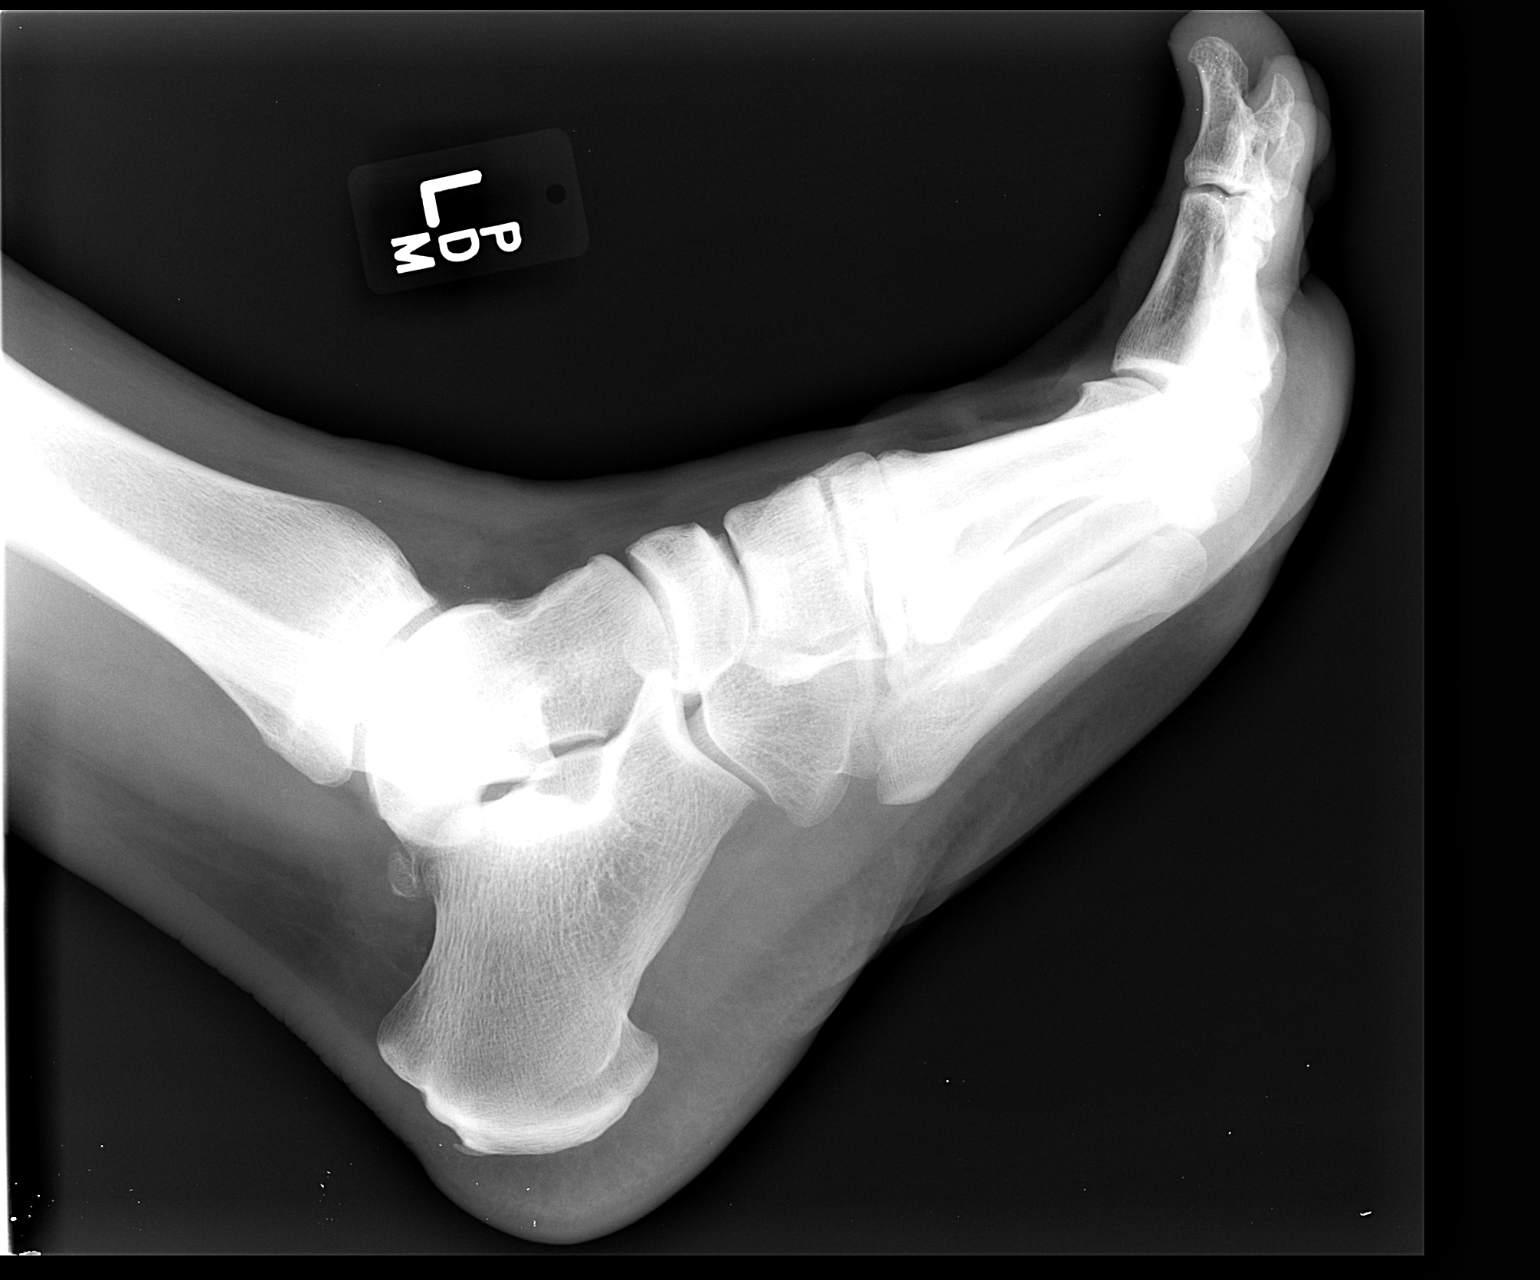

[3 of 3 positions shown; findings below may reference images not displayed]

FINDINGS: No evidence of fracture is seen. Tarsal metatarsal alignment is
normal. Joint spaces appear normal.
IMPRESSION: No evidence of fracture.
